# Patient Record
Sex: Female | Born: 1990 | Race: Black or African American | Hispanic: No | Marital: Single | Smoking: Never smoker
Health system: Southern US, Community
[De-identification: ages and names within clinical notes are randomized; demographics above are authoritative.]

---

## 2018-07-18 ENCOUNTER — Encounter (HOSPITAL_COMMUNITY): Payer: Self-pay

## 2018-07-18 ENCOUNTER — Other Ambulatory Visit: Payer: Self-pay

## 2018-07-18 ENCOUNTER — Emergency Department (HOSPITAL_COMMUNITY): Payer: No Typology Code available for payment source

## 2018-07-18 ENCOUNTER — Emergency Department (HOSPITAL_COMMUNITY)
Admission: EM | Admit: 2018-07-18 | Discharge: 2018-07-18 | Disposition: A | Discharge status: Home / Self Care | Payer: No Typology Code available for payment source | Attending: Emergency Medicine | Admitting: Emergency Medicine

## 2018-07-18 DIAGNOSIS — Y999 Unspecified external cause status: Secondary | ICD-10-CM | POA: Insufficient documentation

## 2018-07-18 DIAGNOSIS — Y939 Activity, unspecified: Secondary | ICD-10-CM | POA: Diagnosis not present

## 2018-07-18 DIAGNOSIS — R1011 Right upper quadrant pain: Secondary | ICD-10-CM | POA: Diagnosis present

## 2018-07-18 DIAGNOSIS — Y92410 Unspecified street and highway as the place of occurrence of the external cause: Secondary | ICD-10-CM | POA: Insufficient documentation

## 2018-07-18 DIAGNOSIS — T07XXXA Unspecified multiple injuries, initial encounter: Secondary | ICD-10-CM

## 2018-07-18 LAB — I-STAT CHEM 8, ED
BUN: 7 mg/dL (ref 6–20)
Calcium, Ion: 1.18 mmol/L (ref 1.15–1.40)
Chloride: 104 mmol/L (ref 98–111)
Creatinine, Ser: 0.9 mg/dL (ref 0.44–1.00)
Glucose, Bld: 98 mg/dL (ref 70–99)
HCT: 39 % (ref 36.0–46.0)
Hemoglobin: 13.3 g/dL (ref 12.0–15.0)
Potassium: 3.4 mmol/L — ABNORMAL LOW (ref 3.5–5.1)
Sodium: 140 mmol/L (ref 135–145)
TCO2: 27 mmol/L (ref 22–32)

## 2018-07-18 LAB — I-STAT BETA HCG BLOOD, ED (MC, WL, AP ONLY): I-stat hCG, quantitative: <5 m[IU]/mL (ref <5)

## 2018-07-18 MED ORDER — IOPAMIDOL (ISOVUE-300) INJECTION 61%
100.0000 mL | Freq: Once | INTRAVENOUS | Status: AC | PRN
  Administered 2018-07-18: 100 mL via INTRAVENOUS

## 2018-07-18 MED ORDER — ONDANSETRON 4 MG PO TBDP
4.0000 mg | ORAL_TABLET | Freq: Once | ORAL | Status: AC
  Administered 2018-07-18: 4 mg via ORAL
  Filled 2018-07-18: qty 1

## 2018-07-18 MED ORDER — SODIUM CHLORIDE 0.9 % IJ SOLN
INTRAMUSCULAR | Status: AC
  Filled 2018-07-18: qty 50

## 2018-07-18 MED ORDER — IOPAMIDOL (ISOVUE-300) INJECTION 61%
INTRAVENOUS | Status: AC
  Filled 2018-07-18: qty 100

## 2018-07-18 MED ORDER — NAPROXEN 500 MG PO TABS: 500.0000 mg | ORAL_TABLET | Freq: Two times a day (BID) | ORAL | 0 refills | Status: AC

## 2018-07-18 MED ORDER — HYDROCODONE-ACETAMINOPHEN 5-325 MG PO TABS
1.0000 | ORAL_TABLET | Freq: Once | ORAL | Status: AC
  Administered 2018-07-18: 1 via ORAL
  Filled 2018-07-18: qty 1

## 2018-07-18 MED ORDER — CYCLOBENZAPRINE HCL 5 MG PO TABS: 5.0000 mg | ORAL_TABLET | Freq: Three times a day (TID) | ORAL | 0 refills | Status: AC | PRN

## 2018-07-18 NOTE — Discharge Instructions (Addendum)
Follow up with your doctor or return here as needed for worsening symptoms. Do not take the muscle relaxer if driving or at work as it will make you sleepy.
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
